# Patient Record
Sex: Male | Born: 1975 | Race: Black or African American | Hispanic: No | Marital: Married | Smoking: Never smoker
Health system: Southern US, Community
[De-identification: ages and names within clinical notes are randomized; demographics above are authoritative.]

---

## 2004-06-14 ENCOUNTER — Emergency Department (HOSPITAL_COMMUNITY): Admission: EM | Admit: 2004-06-14 | Discharge: 2004-06-14 | Payer: Self-pay | Admitting: Emergency Medicine

## 2006-05-10 IMAGING — CT CT HEAD W/O CM
1 series · 16 of 30 positions shown, 20 images · IV contrast (agent unspecified)
Comparison: none

CLINICAL DATA: Slurred speech and left-sided weakness.  
 HEAD CT WITHOUT CONTRAST:
TECHNIQUE: Contiguous axial CT images were taken from the skull base to the vertex without contrast administration.

[Series 2: head_seq 4.5 h40s st · axial · 0.43mm/px · z∈[-583,-439]mm · 16 of 36 slices shown, 20 images]
[im 2/36  brain]
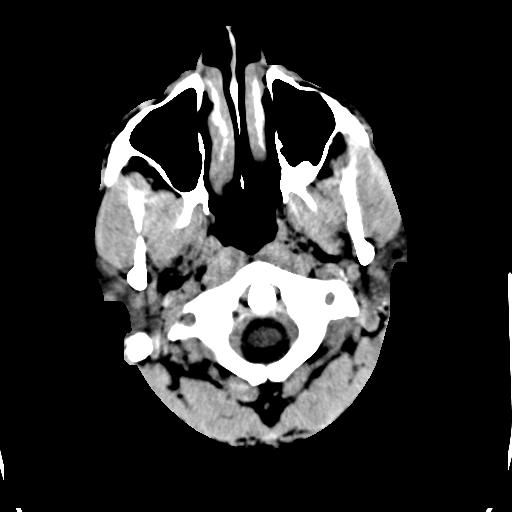
[im 2/36  bone]
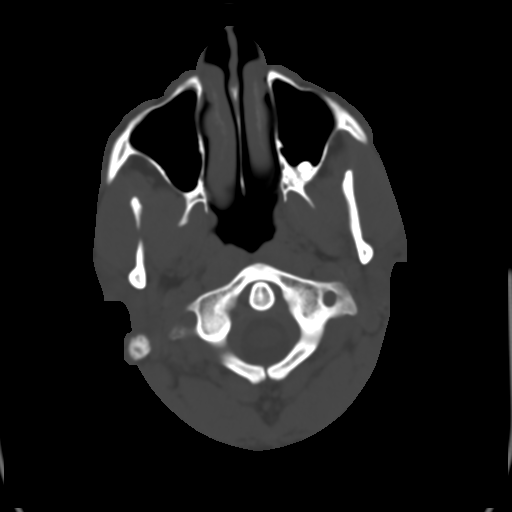
[im 4/36  brain]
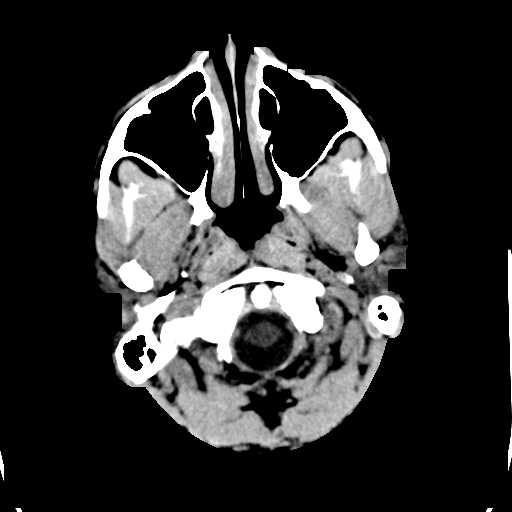
[im 7/36  brain]
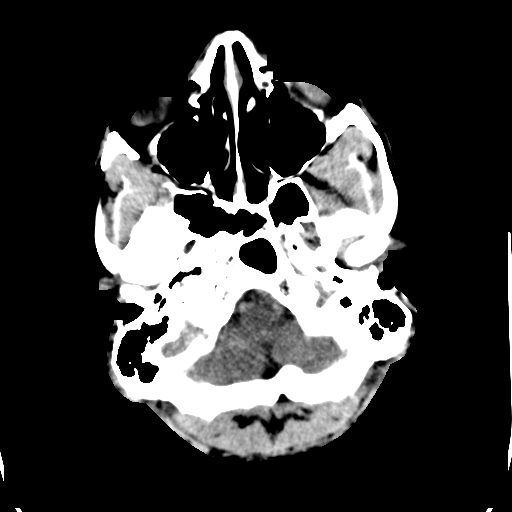
[im 9/36  brain]
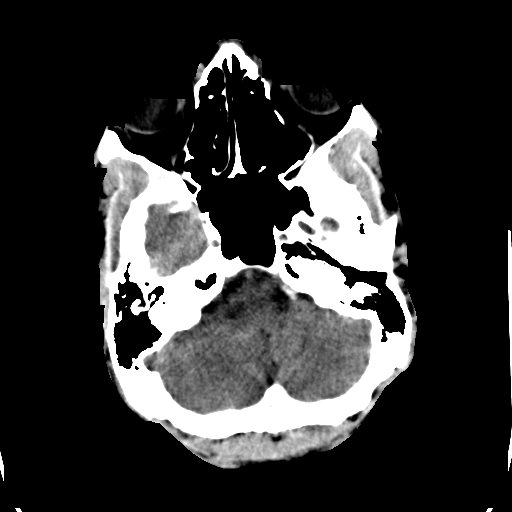
[im 10/36  brain]
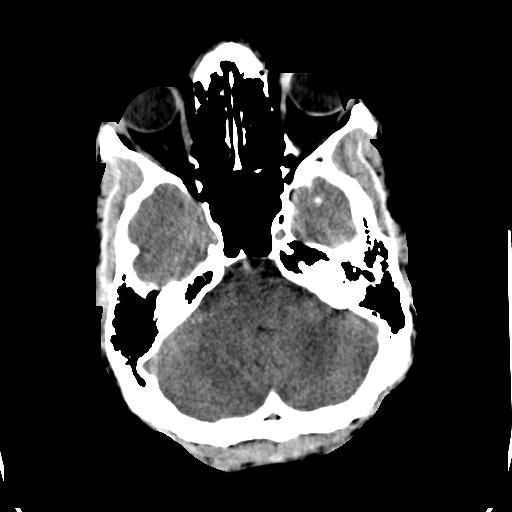
[im 10/36  bone]
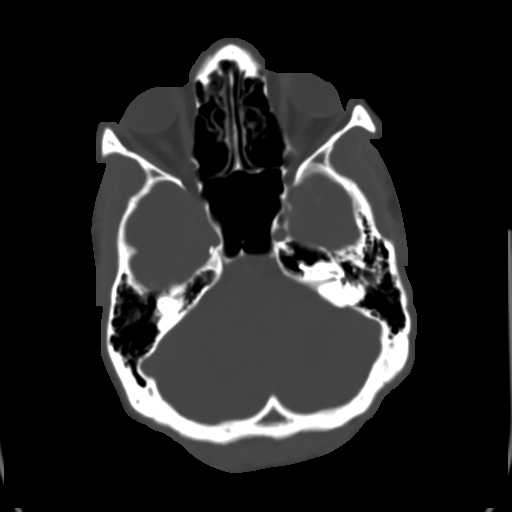
[im 13/36  brain]
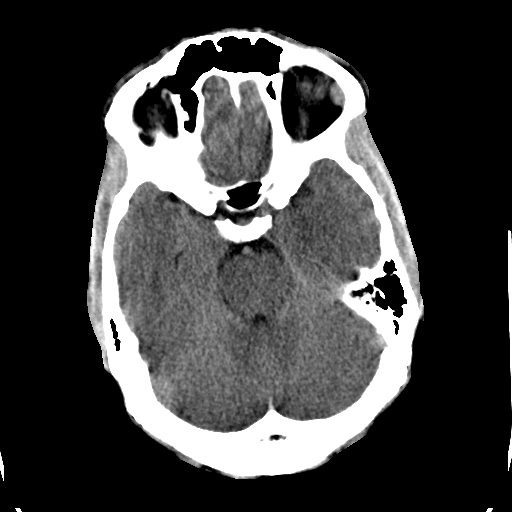
[im 15/36  brain]
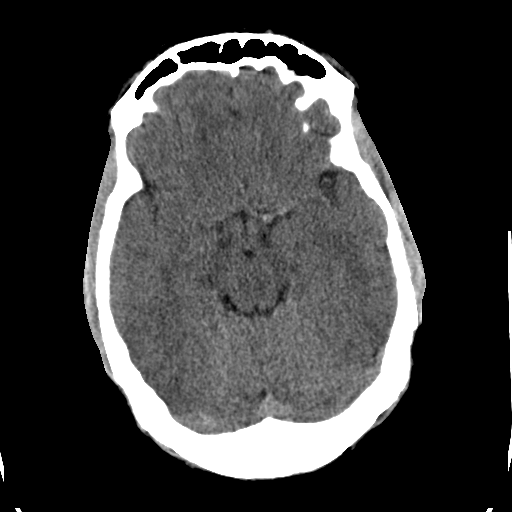
[im 17/36  brain]
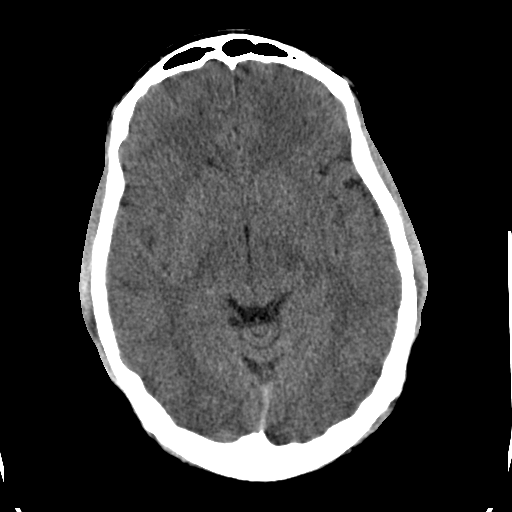
[im 19/36  brain]
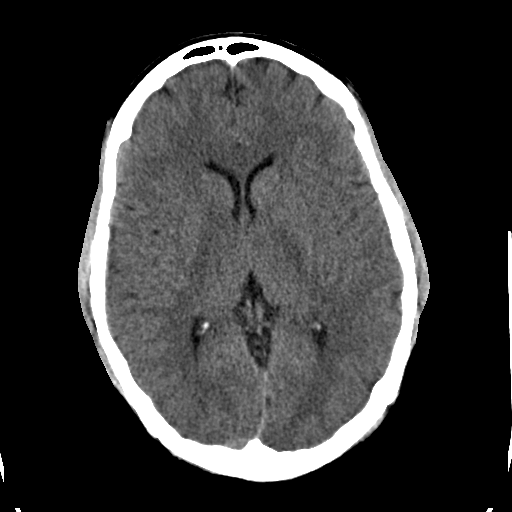
[im 19/36  bone]
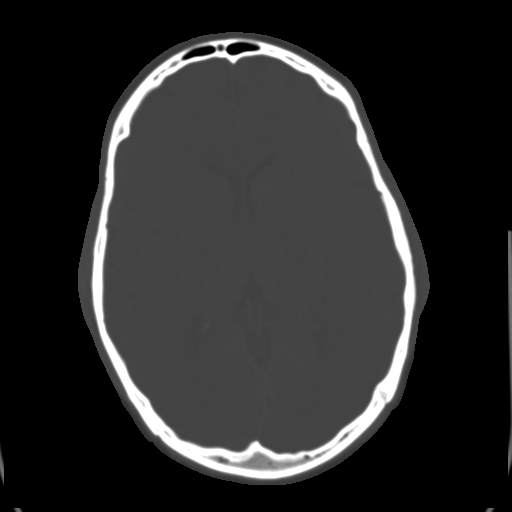
[im 21/36  brain]
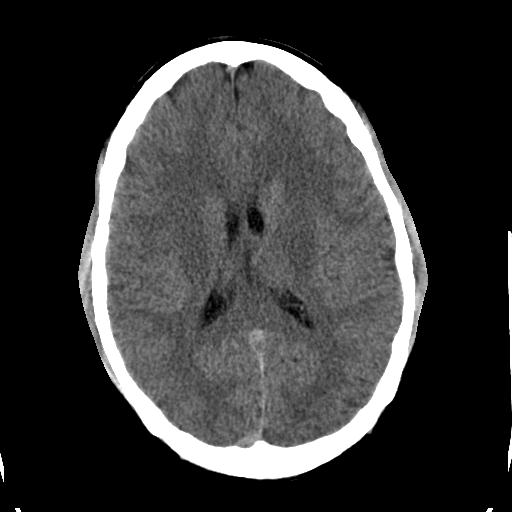
[im 23/36  brain]
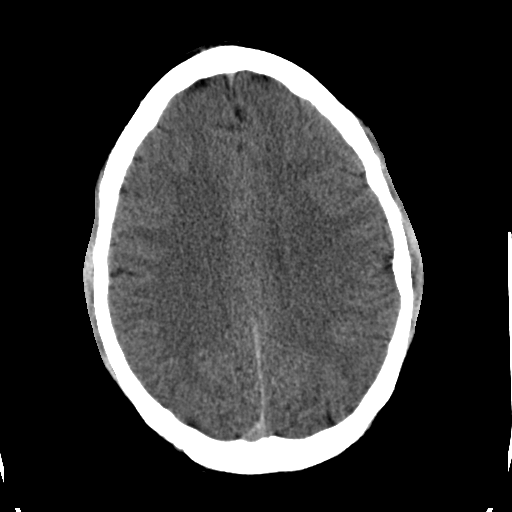
[im 26/36  brain]
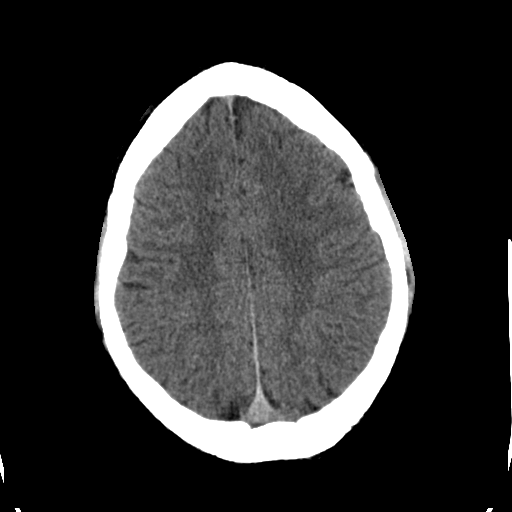
[im 27/36  brain]
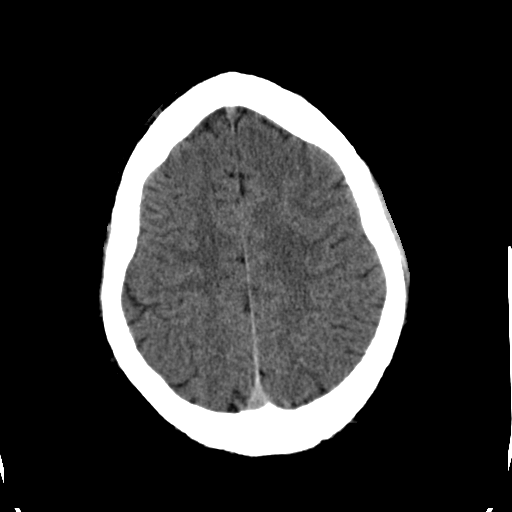
[im 27/36  bone]
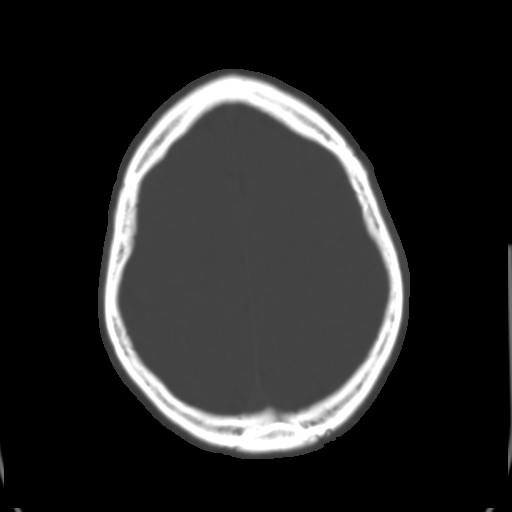
[im 29/36  brain]
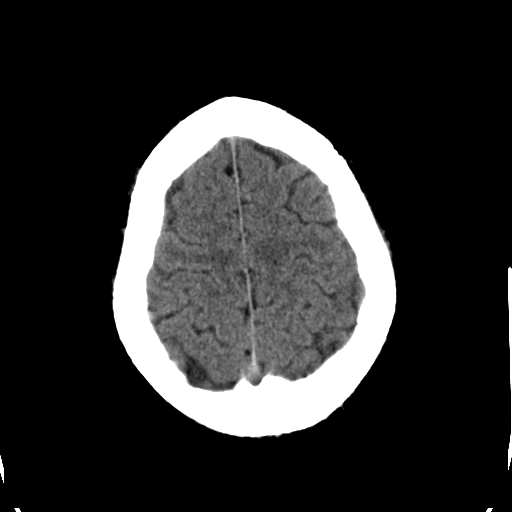
[im 32/36  brain]
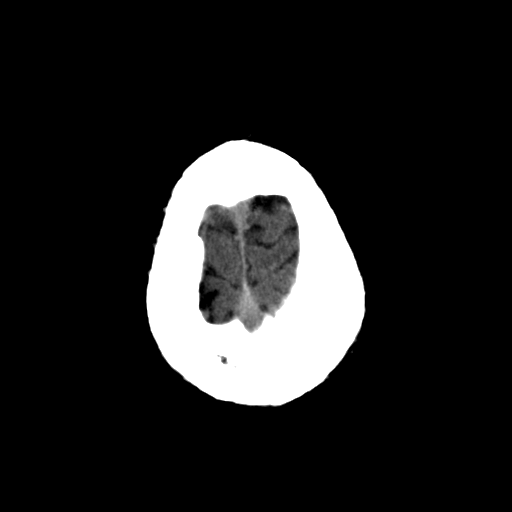
[im 34/36  brain]
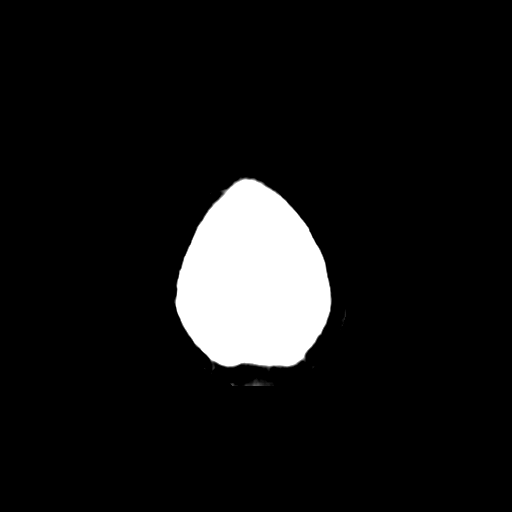

[16 of 30 positions shown; findings below may reference images not displayed]

FINDINGS: No evidence of acute intracranial abnormality including hemorrhage, infarct, mass, mass effect, midline shift or abnormal extraaxial fluid collections identified.  There is no hydrocephalus.  No acute bony abnormality.
IMPRESSION: Negative head CT.

## 2014-07-03 ENCOUNTER — Ambulatory Visit (INDEPENDENT_AMBULATORY_CARE_PROVIDER_SITE_OTHER): Payer: Self-pay | Admitting: Family Medicine

## 2014-07-03 VITALS — BP 118/68 | HR 85 | Temp 98.8°F | Resp 17 | Ht 74.5 in | Wt 179.0 lb

## 2014-07-03 DIAGNOSIS — L42 Pityriasis rosea: Secondary | ICD-10-CM

## 2014-07-03 NOTE — Progress Notes (Signed)
° °  Subjective:    Patient ID: Julian Smith, male    DOB: [birth year], 39 y.o.   MRN: [id number] This chart was scribed for Elvina SidleKurt Lauenstein, MD, by Ronney LionSuzanne Le, ED Scribe. This patient was seen in room 2 and the patient's care was started at 1:15 PM.   HPI HPI Comments: Julian Smith is a 39 y.o. male who presents to the [facility] complaining of a constant, elliptical rash on his left forearm that he noticed 3 weeks ago, and smaller variations of the rash on his right arm and right ankle. Patient denies any chronic medical conditions. He has no known allergies. Patient has been taking Naproxen. He was working on an [employer] manage[employer] pipe and thought he might have burned his skin.   Patient also mentions having a left knee injury that has been ongoing for 4 years. He states he has associated lower back pain, which he was told might be due to his knee pressing on a nerve. Patient tries to exercise in the gym because he works from [employer]. 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  Review of Systems  Musculoskeletal: Positive for back pain and arthralgias.  Skin: Positive for rash.      Objective:   Physical Exam  Constitutional: He is oriented to person, place, and time. He appears well-developed and well-nourished. No distress.  HENT:  Head: Normocephalic and atraumatic.  Eyes: Conjunctivae and EOM are normal.  Neck: Neck supple. No tracheal deviation present.  Cardiovascular: Normal rate.   Pulmonary/Chest: Effort normal. No respiratory distress.  Musculoskeletal: Normal range of motion.  Neurological: He is alert and oriented to person, place, and time.  Skin: Skin is warm and dry. Rash noted.  Psychiatric: He has a normal mood and affect. His behavior is normal.  Nursing note and vitals reviewed.  patient has a diffuse rash over his arms and torso with elliptical orientation corresponding to dermatomes.    Assessment & Plan:    This chart was scribed in my presence and reviewed by me personally.    ICD-9-CM ICD-10-CM   1. Pityriasis rosea 696.3 L42      Signed, Elvina SidleKurt Lauenstein, MD

## 2014-07-03 NOTE — Patient Instructions (Signed)
Pityriasis Rosea  Pityriasis rosea is a rash which is probably caused by a virus. It generally starts as a scaly, red patch on the trunk (the area of the body that a t-shirt would cover) but does not appear on sun exposed areas. The rash is usually preceded by an initial larger spot called the "herald patch" a week or more before the rest of the rash appears. Generally within one to two days the rash appears rapidly on the trunk, upper arms, and sometimes the upper legs. The rash usually appears as flat, oval patches of scaly pink color. The rash can also be raised and one is able to feel it with a finger. The rash can also be finely crinkled and may slough off leaving a ring of scale around the spot. Sometimes a mild sore throat is present with the rash. It usually affects children and young adults in the spring and autumn. Women are more frequently affected than men.  TREATMENT   Pityriasis rosea is a self-limited condition. This means it goes away within 4 to 8 weeks without treatment. The spots may persist for several months, especially in darker-colored skin after the rash has resolved and healed. Benadryl and steroid creams may be used if itching is a problem.  SEEK MEDICAL CARE IF:   · Your rash does not go away or persists longer than three months.  · You develop fever and joint pain.  · You develop severe headache and confusion.  · You develop breathing difficulty, vomiting and/or extreme weakness.  Document Released: 05/17/2001 Document Revised: 07/03/2011 Document Reviewed: 06/05/2008  [employer]® Patient Information ©2015 [employer]. This information is not intended to replace advice given to you by your health care provider. Make sure you discuss any questions you have with your health care provider.

## 2018-06-11 ENCOUNTER — Ambulatory Visit
Admission: EM | Admit: 2018-06-11 | Discharge: 2018-06-11 | Disposition: A | Discharge status: Home / Self Care | Payer: BLUE CROSS/BLUE SHIELD | Attending: Family Medicine | Admitting: Family Medicine

## 2018-06-11 DIAGNOSIS — R0789 Other chest pain: Secondary | ICD-10-CM | POA: Diagnosis not present

## 2018-06-11 MED ORDER — MELOXICAM 15 MG PO TABS: 15.0000 mg | ORAL_TABLET | Freq: Every day | ORAL | 1 refills | Status: AC

## 2018-06-11 NOTE — ED Triage Notes (Signed)
Pt c/o lt sided chest pain off and on x2wks, worse in the past 2hrs with pain radiating to lt shoulder

## 2018-06-11 NOTE — ED Provider Notes (Addendum)
[facility]    CSN: [id number] Arrival date & time: 06/11/18  1025     History   Chief Complaint Chief Complaint  Patient presents with  . Chest Pain    HPI Julian Smith is a 43 y.o. male.   Pt c/o lt sided chest pain off and on x2wks, worse in the past 2hrs with pain radiating to lt shoulder   Pain was intermittent and associated with anxiety when driving.  No SOB, nausea, diaphoresis  No F/H early coronary disease, smoking, htn, hyperlipidemia, diabettes     History reviewed. No pertinent past medical history.  There are no active problems to display for this patient.   History reviewed. No pertinent surgical history.     Home Medications    Prior to Admission medications   Medication Sig Start Date End Date Taking? Authorizing Provider  meloxicam (MOBIC) 15 MG tablet Take 1 tablet (15 mg total) by mouth daily. 06/11/18   Elvina Sidle, MD    Family History Family History  Problem Relation Age of Onset  . Cancer Father     Social History Social History   Tobacco Use  . Smoking status: Never Smoker  . Smokeless tobacco: Never Used  Substance Use Topics  . Alcohol use: Yes    Alcohol/week: 0.0 standard drinks  . Drug use: No     Allergies   Patient has no known allergies.   Review of Systems Review of Systems   Physical Exam Triage Vital Signs ED Triage Vitals  Enc Vitals Group     BP 06/11/18 1034 (!) 143/89     Pulse Rate 06/11/18 1034 70     Resp 06/11/18 1034 18     Temp 06/11/18 1034 98.3 F (36.8 C)     Temp Source 06/11/18 1034 Oral     SpO2 06/11/18 1034 98 %     Weight --      Height --      Head Circumference --      Peak Flow --      Pain Score 06/11/18 1035 0     Pain Loc --      Pain Edu? --      Excl. in GC? --    No data found.  Updated Vital Signs BP (!) 143/89 (BP Location: Left Arm)   Pulse 70   Temp 98.3 F (36.8 C) (Oral)   Resp 18   SpO2 98%    Physical Exam Vitals signs and  nursing note reviewed.  Constitutional:      Appearance: He is well-developed and normal weight.  HENT:     Head: Normocephalic.  Neck:     Musculoskeletal: Normal range of motion.  Cardiovascular:     Rate and Rhythm: Normal rate.     Heart sounds: Normal heart sounds.  Pulmonary:     Effort: Pulmonary effort is normal.     Breath sounds: Normal breath sounds.  Musculoskeletal: Normal range of motion.     Left lower leg: He exhibits no tenderness.  Skin:    General: Skin is warm and dry.  Neurological:     General: No focal deficit present.     Mental Status: He is alert.  Psychiatric:        Mood and Affect: Mood is anxious.      UC Treatments / Results  Labs (all labs ordered are listed, but only abnormal results are displayed) Labs Reviewed - No data to display  EKG None  Radiology No results found.  Procedures Procedures (including critical care time)  Medications Ordered in UC Medications - No data to display  Initial Impression / Assessment and Plan / UC Course  I have reviewed the triage vital signs and the nursing notes.  Pertinent labs & imaging results that were available during my care of the patient were reviewed by me and considered in my medical decision making (see chart for details).    Final Clinical Impressions(s) / UC Diagnoses   Final diagnoses:  Chest wall pain   Discharge Instructions   None    ED Prescriptions    Medication Sig Dispense Auth. Provider   meloxicam (MOBIC) 15 MG tablet Take 1 tablet (15 mg total) by mouth daily. 30 tablet Elvina Sidle, MD     Controlled Substance Prescriptions [place] Controlled Substance Registry consulted? Not Applicable   Elvina Sidle, MD 06/11/18 1130    Elvina Sidle, MD 06/11/18 1130
